# Patient Record
Sex: Male | Born: 1960
Health system: Southern US, Community
[De-identification: ages and names within clinical notes are randomized; demographics above are authoritative.]

---

## 2003-04-16 ENCOUNTER — Ambulatory Visit (HOSPITAL_COMMUNITY): Admission: RE | Admit: 2003-04-16 | Discharge: 2003-04-16 | Payer: Self-pay | Admitting: Orthopedic Surgery

## 2004-09-12 ENCOUNTER — Ambulatory Visit (HOSPITAL_COMMUNITY): Admission: RE | Admit: 2004-09-12 | Discharge: 2004-09-12 | Payer: Self-pay | Admitting: Orthopedic Surgery

## 2004-11-15 ENCOUNTER — Inpatient Hospital Stay (HOSPITAL_COMMUNITY): Admission: RE | Admit: 2004-11-15 | Discharge: 2004-11-17 | Payer: Self-pay | Admitting: Orthopedic Surgery

## 2006-04-10 IMAGING — CR DG PORTABLE PELVIS
2 series · 2 of 2 positions shown · non-contrast
Comparison: 11/09/04.

CLINICAL DATA: Hip replacement. 
 PORTABLE PELVIS (WITH LEFT HIP) - 2 VIEW:

[view not recorded (1 of 2)]
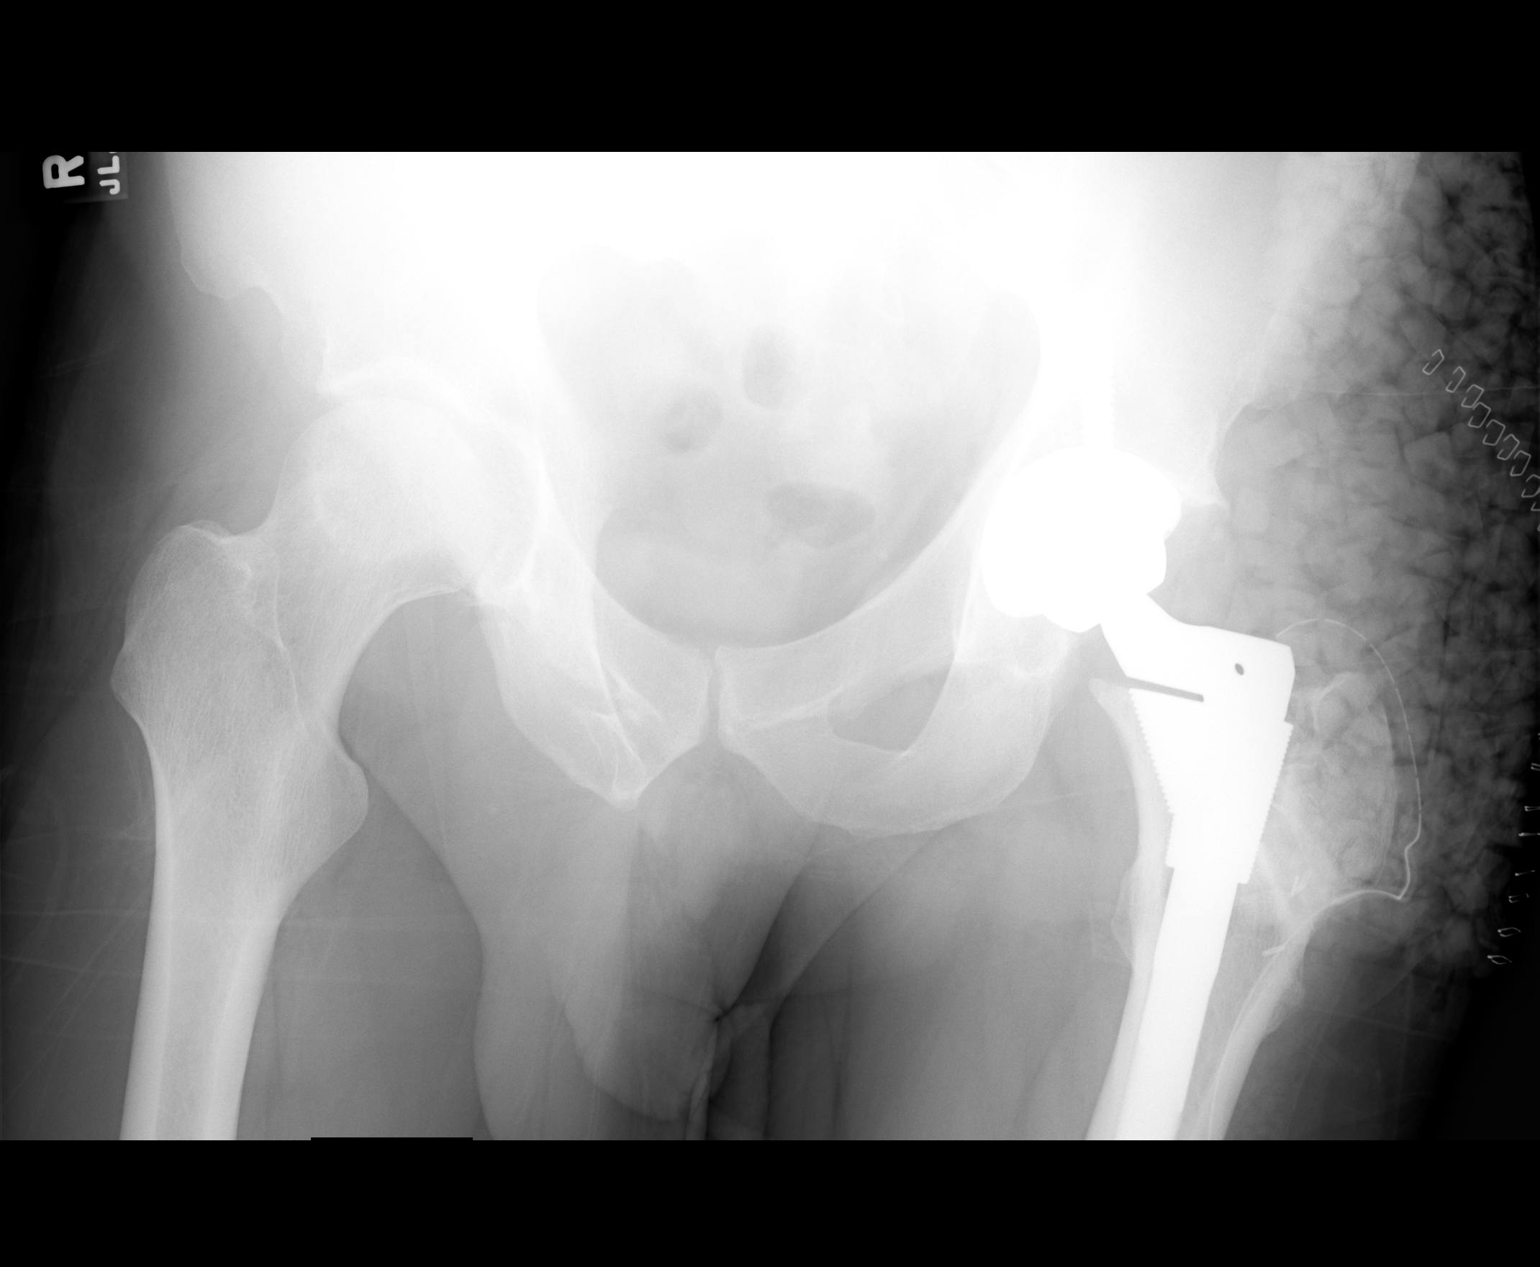

[view not recorded (2 of 2)]
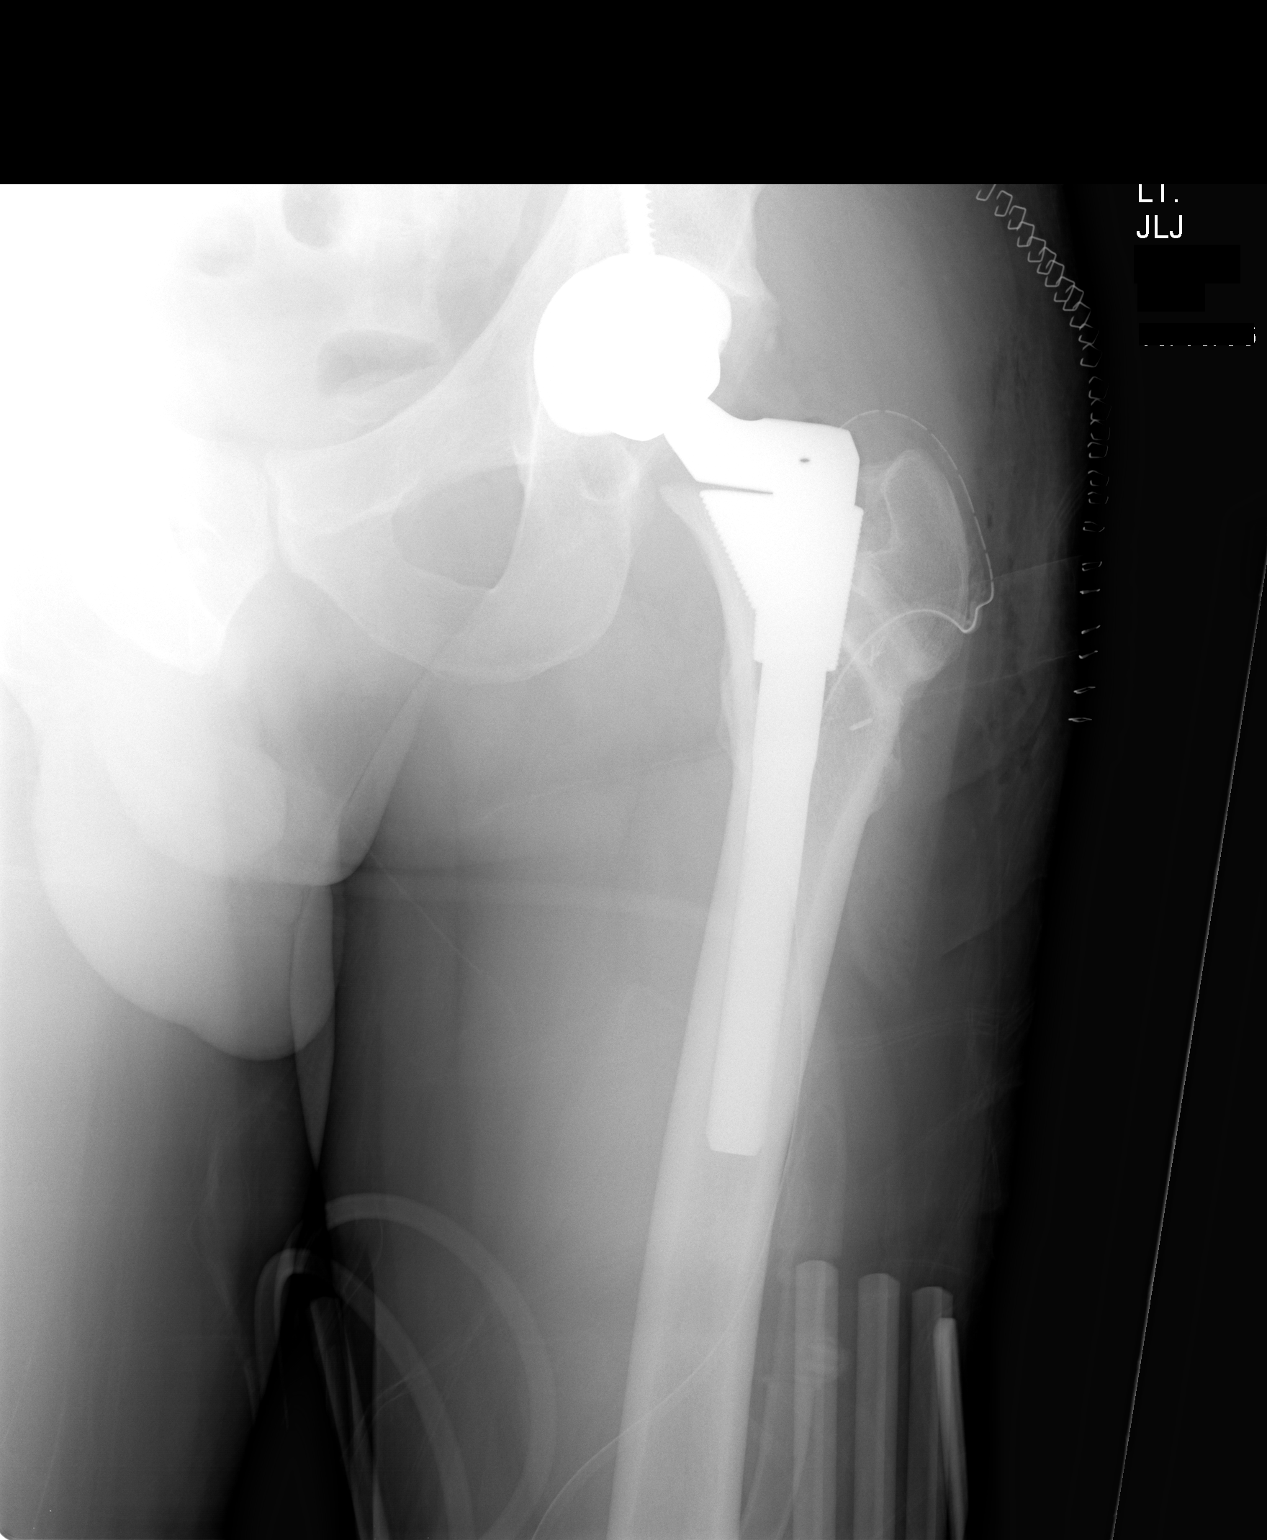

[2 of 2 positions shown; findings below may reference images not displayed]

FINDINGS: The patient has undergone interval left total hip replacement.  Device is located.  No fracture or other complicating features.  Surgical staples and drain are in place.  Avascular necrosis of the right hip is again noted.
IMPRESSION: 1.  Left total hip replacement without complicating features. 
 2.  Avascular necrosis of the right hip.

## 2007-07-19 ENCOUNTER — Encounter: Admission: RE | Admit: 2007-07-19 | Discharge: 2007-07-19 | Payer: Self-pay | Admitting: Orthopedic Surgery

## 2007-11-26 ENCOUNTER — Inpatient Hospital Stay (HOSPITAL_COMMUNITY): Admission: RE | Admit: 2007-11-26 | Discharge: 2007-11-27 | Payer: Self-pay | Admitting: Orthopedic Surgery

## 2009-04-20 IMAGING — CR DG PORTABLE PELVIS
1 series · 1 of 1 positions shown · non-contrast
Comparison: 11/09/2004

CLINICAL DATA: Post right hip arthroplasty

PORTABLE PELVIS

[view not recorded]
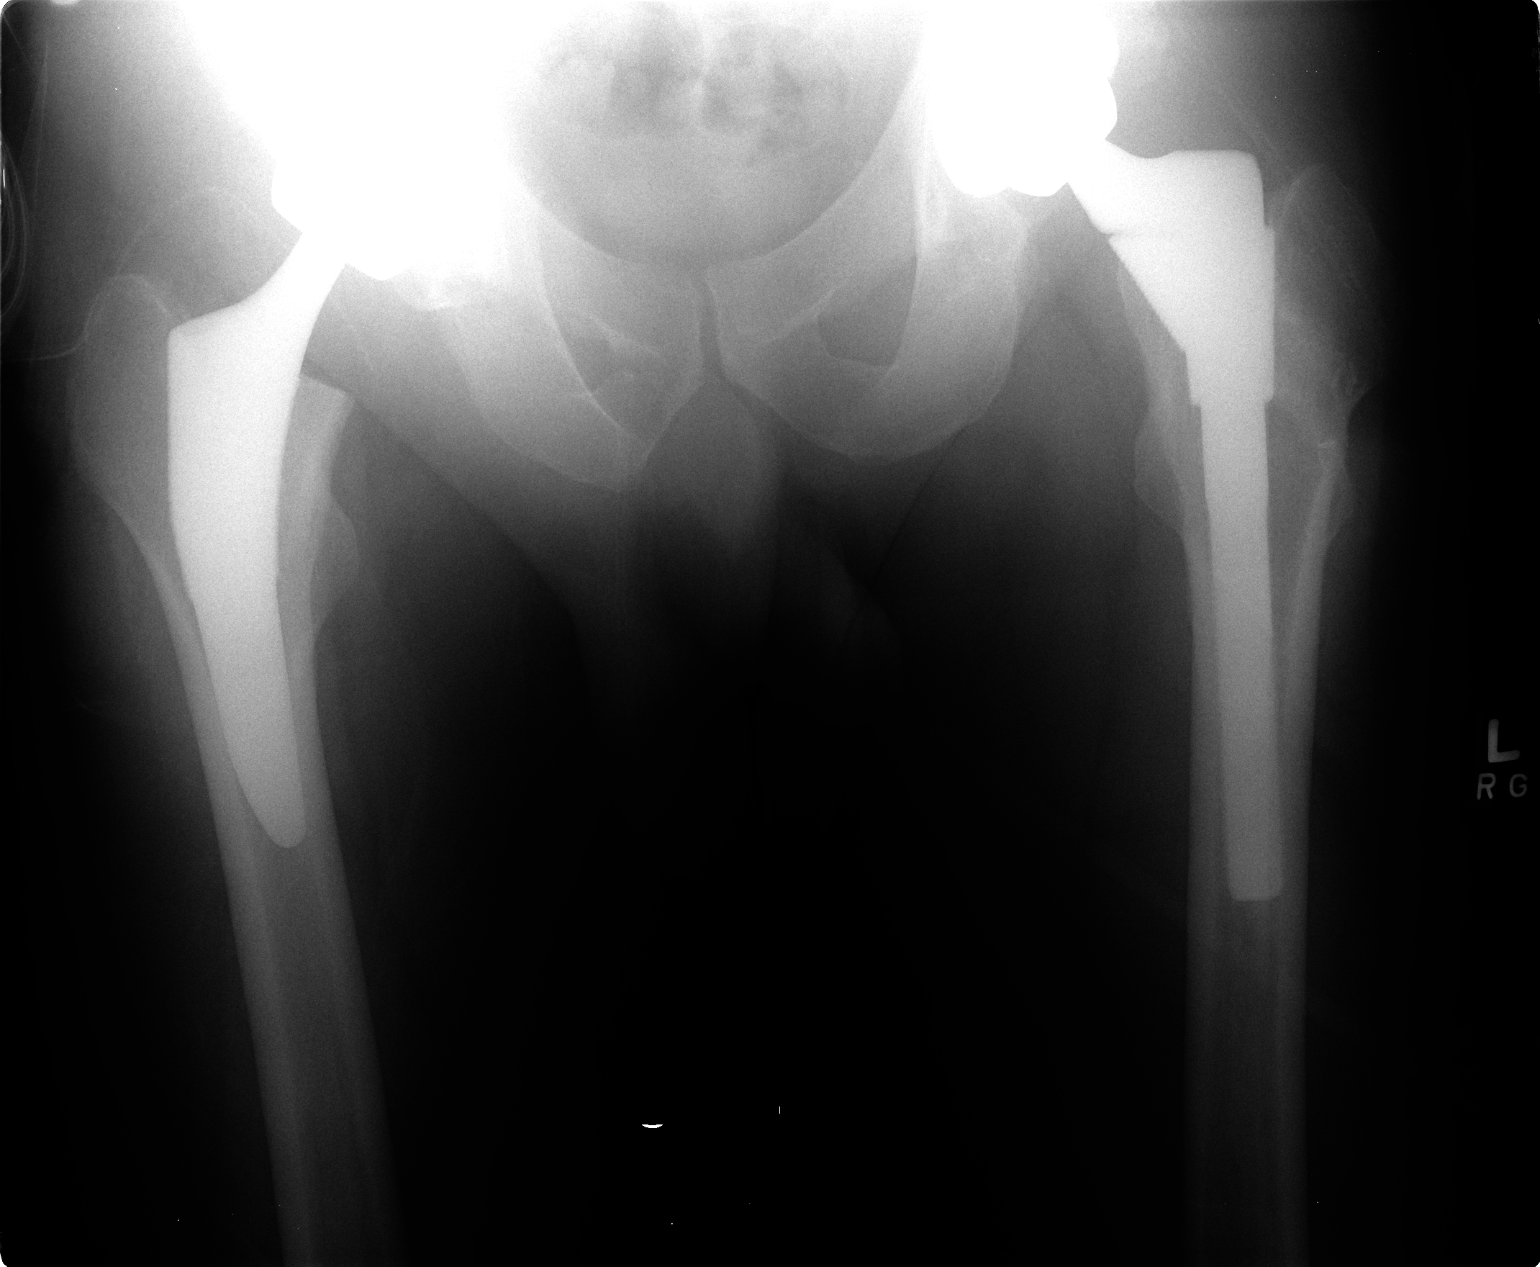

[1 of 1 positions shown; findings below may reference images not displayed]

FINDINGS: The patient has had bilateral hip replacement.  According
to the history, the right hip was operated today.

Good position alignment in one-view.  No acute findings.
IMPRESSION: Good position alignment in one-view following right total hip
replacement.  The patient is also had prior left hip replacement.

## 2010-06-21 NOTE — Op Note (Signed)
NAMELILY, KERNEN NO.:  [id number]   MEDICAL RECORD NO.:  [id number]          PATIENT TYPE:  INP   LOCATION:  1602                         FACILITY:  [facility]   PHYSICIAN:  Madlyn Frankel. Charlann Boxer, M.D.  DATE OF BIRTH:  08-Dec-1960   DATE OF PROCEDURE:  11/26/2007  DATE OF DISCHARGE:  11/27/2007                               OPERATIVE REPORT   ADDENDUM:  It was stated in the operative note that preoperative  diagnosis was right hip avascular necrosis, postoperative diagnosis was  same.  The procedure was misstated as right total knee replacement.  It  should state right total hip replacement.  The remainder of the  procedure notes including the utilization of DePuy hip system in  addition to the body the paragraph are consistent with the procedure of  right total hip replacement.      Madlyn Frankel Charlann Boxer, M.D.  Electronically Signed     MDO/MEDQ  D:  12/19/2007  T:  12/20/2007  Job:  [id number]

## 2010-06-21 NOTE — H&P (Signed)
NAME:  PHOENIX, DRESSER NO.:  [id number]   MEDICAL RECORD NO.:  [id number]          PATIENT TYPE:  INP   LOCATION:                               FACILITY:  [facility]   PHYSICIAN:  Madlyn Frankel. Charlann Boxer, M.D.  DATE OF BIRTH:  Mar 10, 1960   DATE OF ADMISSION:  11/26/2007  DATE OF DISCHARGE:                              HISTORY & PHYSICAL   PROCEDURE:  Right total hip replacement by Dr. Lajoyce Corners.   CHIEF COMPLAINT:  Right hip pain.   HISTORY OF PRESENT ILLNESS:  A 50 year old male with a history of right  hip pain secondary to avascular necrosis.  This has been refractory to  all conservative treatment.  He has a history of left total hip  replacement for this same condition back in 2006 and has done very well.   PRIMARY CARE PHYSICIAN:  Dr. Susa Loffler.   PAST MEDICAL HISTORY:  1. Avascular necrosis.  2. Kidney stones.  3. Gout.   PAST SURGICAL HISTORY:  Left total hip replacement in October 2006.   FAMILY HISTORY:  Heart disease, diabetes, asthma.   SOCIAL HISTORY:  Married, smokes one pack cigarettes, has 25 pack-year  history.   PRIMARY CAREGIVER:  Will be family in the home.   MED ALLERGIES:  NO KNOWN DRUG ALLERGIES.   CURRENT MEDICATIONS:  No current medications.   REVIEW OF SYSTEMS:  See HPI.   PHYSICAL EXAMINATION:  Pulse 72, respirations 16, blood pressure 114/80.  GENERAL:  Awake, alert and oriented, well-developed and well-nourished.  NECK:  Supple, no carotid bruits.  CHEST:  Lungs clear to auscultation bilaterally.  BREASTS:  Deferred.  HEART:  Regular rate and rhythm, S1-S2 distinct.  ABDOMEN:  Soft, nontender, nondistended, bowel sounds present.  GENITOURINARY:  Deferred.  EXTREMITIES:  Right hip has increased pain with range of motion.  SKIN:  No cellulitis.  NEUROLOGIC:  Intact, distal sensibilities.   LABORATORY DATA:  Labs, EKG, chest x-ray all pending pre-surgical  testing.   IMPRESSION:  Right hip osteoarthritis.   PLAN OF ACTION:   Right total hip replacement at [facility] on  November 26, 2007 by Dr. Lajoyce Corners.  Risks and complications were  discussed.   POSTOPERATIVE MEDICATIONS:  Including Lovenox, Robaxin, iron, aspirin,  Colace, MiraLAX provided at the time of history and physical.  Pain  medicines will be dispensed at the time of the surgery.     ______________________________  Yetta Glassman [address], [place]      Madlyn Frankel. Charlann Boxer, M.D.  Electronically Signed    BLM/MEDQ  D:  11/20/2007  T:  11/20/2007  Job:  [id number]   cc:   Susa Loffler, MD

## 2010-06-21 NOTE — Op Note (Signed)
NAME:  Brian Harmon, Brian Harmon NO.:  [id number]   MEDICAL RECORD NO.:  [id number]          PATIENT TYPE:  INP   LOCATION:  0002                         FACILITY:  [facility]   PHYSICIAN:  Madlyn Frankel. Charlann Boxer, M.D.  DATE OF BIRTH:  03-Mar-1960   DATE OF PROCEDURE:  11/26/2007  DATE OF DISCHARGE:                               OPERATIVE REPORT   PREOPERATIVE DIAGNOSIS:  Right hip avascular necrosis.   POSTOPERATIVE DIAGNOSIS:  Right hip avascular necrosis.   PROCEDURE:  Right total knee replacement utilizing DePuy hip system,  size 56 pinnacle cup, 46 neutral metal liner, a size 10 high trial lock  stem with a 40 +5 ball.   SURGEON:  Madlyn Frankel. Charlann Boxer, M.D.   ASSISTANT:  Yetta Glassman. Mann, PA.   ANESTHESIA:  General.   BLOOD LOSS:  400.   COMPLICATIONS:  None.   DRAINS:  Times one medium Hemovac.   SPECIMENS:  None.   INDICATIONS FOR PROCEDURE:  Mr. Farooqui is a 50 year old patient of  mine with an unfortunate progressive of right hip avascular necrosis.  He had been a previous patient of mine after conversion of previous left  hip surgery to a left total hip placement that he has done extremely  well with.  We reviewed the risks and benefits of the hip replacement  procedure, particular in the setting of this primary surgery as opposed  to what he had previously experienced.  Consent was obtained for the  benefit of pain relief.   PROCEDURE IN DETAIL:  The patient was brought to the operative theater.  Once adequate anesthesia, preoperative antibiotics, Ancef 2 gm  administered, the patient was positioned in the right lateral decubitus  position with the right side up.  The right lower extremity was pre-  scrubbed, prepped and draped in a sterile fashion.  A timeout was  performed, identifying the correct extremity and the patient.  A lateral  based incision was made for a posterior approach to the hip.  The  iliotibial band and gluteal fascia were exposed and incised  posteriorly.  The short external rotators were then identified and taken down separate  from the posterior capsule.  An L capsulotomy was made, preserving the  posterior capsule for later anatomic repair, as well as protection of  the sciatic nerve from retractors.  The hip was dislocated.  Severe  avascular changes noted in the femoral head with collapse and cartilage  delamination and wrinkling.   The neck osteotomy was made utilizing a size 6-7 high offset neck with a  28 ball placed in the center of the head.   Attention was now directed to the femur.  I used the box osteotome to  assure laterality along the neck.  I then used a starting drill and  reamed once with a hand reamer and then irrigated the canal to prevent  fat emboli.  I began to broach with a 2-4-6 and then a 7-8-9 initially  went all the way to 9, at the level of the neck cut, and I did not do a  calcar planer to remove a  few mm of bone on the medial side of the neck.   At this point, I attended to the acetabulum.  Acetabular retractors were  placed.  I did have to remove a portion of the anterior capsule and  allowed the femur to be retracted anteriorly due to the significantly  tight and retracted joint space.  Following debridement of the labrum,  identifying calcified labrum anteriorly, I began reaming with a 46  reamer and carried this all the way up to a 54 reamer and then a 55 and  which had an excellent bony bed preparation.  I used an osteotome to  remove the calcified labrum anterior aspect of the pelvis to correctly  identify the orientation of the pelvis, in addition to preventing  impingement.   The final 56 cup was then impacted in approximately 35-40 degrees of  abduction and 20 degrees of forward flexion beneath the anterior wall,  particularly after removing the osteophytes.   A neutral liner was placed as a trial and attention was directed to a  trial reduction.  When I went back to the femoral  side, I impacted the  size 9, was able to get this subside a few more mm, so I went up to a  size 10.  The size 10 sat at the level of my neck cut at this point.  Trial reduction was carried out with a 10 high neck and a 40 +1.5 ball.  The hip was very stable throughout range of motion, no evidence of  impingement with external rotation, abduction.  No evidence of  impingement with forward flexion, internal rotation up to 80 degrees and  no evidence and subluxation in the sleep position.  Leg lengths appeared  to be a little bit shorter than what I had seen in the preoperative  state.  I went ahead and positioned the patient.   Given this, we removed all the trial components and placed a single 40  mm cancellous screw into the ilium.  Due to excellent initial scratch  fit, placed a hole eliminator and then placed a 40 mm metal liner  without difficulty or problems.   The final 10 high trial lock stem was impacted at the level where the  broach sat.  I trialed the 40 +5 ball for lengthening.   On the contralateral hip, I used an SROM system with 20-15 stem with a  36 +10 and +12 offset.  The 40 +5 ball would allow for improved offset  in addition to the stem placement.   Trial reduction revealed that the leg lengths appeared to be comparable  to the down leg and the hip range of motion remained excellent without  evidence of impingement.  Given this, the final 40 +5 ball was impacted  onto a dry trunnion and the hip reduced.  We irrigated the hip  throughout the case.  I reapproximated the posterior leaflet to the  superior leaflet using #1 Vicryl.  A medium Hemovac drain was placed  deep.  The iliotibial band and gluteal fascia were then reapproximated  using #1 Vicryl.  The remaining of the wound was closed with 2-0 Vicryl  and a running 4-0 Monocryl.  The hip was cleaned, dried and dressed  sterilely with a Mepilex dressing.  The patient was taken to the  recovery room in stable  condition, tolerating the procedure well.      Madlyn Frankel Charlann Boxer, M.D.  Electronically Signed     MDO/MEDQ  D:  11/26/2007  T:  11/26/2007  Job:  [id number]

## 2010-06-24 NOTE — Discharge Summary (Signed)
NAME:  Brian Harmon, Brian Harmon NO.:  [id number]   MEDICAL RECORD NO.:  [id number]          PATIENT TYPE:  INP   LOCATION:  1508                         FACILITY:  [facility]   PHYSICIAN:  Madlyn Frankel. Charlann Boxer, M.D.  DATE OF BIRTH:  Mar 04, 1960   DATE OF ADMISSION:  11/15/2004  DATE OF DISCHARGE:  11/17/2004                                 DISCHARGE SUMMARY   ADMISSION DIAGNOSIS:  Failed left hip prevascular fibular grafting for  diagnosis of avascular necrosis.   DISCHARGE DIAGNOSIS:  Failed left hip prevascular fibular grafting for  diagnosis of avascular necrosis.   OPERATION:  On November 15, 2004 the patient underwent conversion of previous  hip surgery to a left total hip arthroplasty.   ASSISTANT:  Dr. Georges Lynch. Gioffre.   BRIEF HISTORY:  This 50 year old male had been seen by Dr. Charlann Boxer for  continued and progressive problems concerning pain into his left hip.  The  patient had a vascularized fibular grafting at [facility] some time  back.  Unfortunately, this failed to provide long term relief and he has  collapse and progressive pain into his left hip.  X-rays have shows this  collapse as well.  After the risks and benefits of surgery, the patient was  admitted for the above procedure.   COURSE IN THE HOSPITAL:  The patient tolerated his surgical procedure quite  well.  He remained awake, alert, and quite cheerful.  He was very pleased  with his surgical intervention with marked reduction in his hip joint pain  but had a mild amount of lateral incisional pain.  He began ambulating in  his room at 50% weightbearing as instructed.  It was felt that he could be  maintained in his home environment so arrangements were made for discharge.  On the day of discharge his wound.  Neurovascular was intact to the  operative extremity.  Homan's sign was negative.  His calf was soft.  He had  no respiratory complaints.   LABORATORY VALUES IN THE HOSPITAL:  Hematologically showed  a preoperative  CBC completely within normal limits.  Hemoglobin was 15.2, hematocrit was  43.8.  The final hemoglobin was 12.4, hematocrit was 34.9.  Blood  chemistries were negative.  A urinalysis was negative for urinary tract  infection.  A chest x-ray showed no acute disease.  No electrocardiogram was  done.   CONDITION ON DISCHARGE:  Improved and stable.   PLAN:  The patient is discharged to his home with [facility].  He  will return to see Dr. Charlann Boxer two weeks after the date of surgery.  Use dry  dressing p.r.n. and continue with 50% weightbearing on the operative  extremity.  Prescriptions were given for Vicodin for discomfort and Robaxin  as a muscle relaxant.      Dooley L. Cherlynn June.      Madlyn Frankel Charlann Boxer, M.D.  Electronically Signed    DLU/MEDQ  D:  11/23/2004  T:  11/23/2004  Job:  [id number]

## 2010-06-24 NOTE — Discharge Summary (Signed)
Brian Harmon, Brian Harmon NO.:  [id number]   MEDICAL RECORD NO.:  [id number]          PATIENT TYPE:  INP   LOCATION:  1602                         FACILITY:  [facility]   PHYSICIAN:  Madlyn Frankel. Charlann Boxer, M.D.  DATE OF BIRTH:  Apr 20, 1960   DATE OF ADMISSION:  11/26/2007  DATE OF DISCHARGE:  11/27/2007                               DISCHARGE SUMMARY   ADMITTING DIAGNOSES:  1. Avascular necrosis.  2. Gout.  3. History of kidney stones.   DISCHARGE DIAGNOSES:  1. Avascular necrosis.  2. Gout.  3. Kidney stones.   HISTORY OF PRESENT ILLNESS:  A 50 year old male with a history of right  hip pain secondary to avascular necrosis.  Refractory to all  conservative treatment.   CONSULTS:  None.   PROCEDURE:  Right total hip replacement.  Surgeon, Dr. Durene Romans.  Assistant, Dwyane Luo P.A.-C.  Components used were metal on metal.   LABORATORY DATA:  CBC, final reading, white blood cell count of 6.1,  hemoglobin 13.3, hematocrit 38.2, and platelets 177.  His white cell  differential all within normal limits.  Coagulation, no significant  abnormalities.  Routine chemistry, final readings, sodium 137, potassium  3.9, glucose 148, and creatinine 1.26.  Calcium 8.8.  UA was negative.   CARDIOLOGY:  EKG was sinus bradycardia, otherwise normal ECG.   RADIOLOGY:  No chest x-ray found on chart.   HOSPITAL COURSE:  Patient underwent right total hip replacement and  admitted to orthopedic floor in stable condition.  He made excellent  progress on day 1 on November 27, 2007.  His dressing was dry.  Hemovac  was discontinued.  Minimal drop hemodynamically.  After physical therapy  done twice that day,  patient was stable and ready to go home.   DISCHARGE DISPOSITION:  Discharged home, stable improved condition,  postop day #1 with excellent progress and minimal blood loss.   DISCHARGE PHYSICAL THERAPY:  Weightbearing as tolerated with use of  rolling walker.   DISCHARGE DIET:   Regular as tolerated by patient.   DISCHARGE WOUND CARE:  Keep dry.   DISCHARGE MEDICATIONS:  1. Lovenox 40 mg subcu q.24 x11 days.  2. Robaxin 500 mg p.o. q.6 p.r.n. muscle spasm pain.  3. Enteric-coated aspirin 325 mg p.o. daily x4 weeks after Lovenox      completed.  4. Iron 325 mg p.o. t.i.d.  5. Colace 100 mg p.o. b.i.d.  6. MiraLax 17 g p.o. daily.  7. Vicodin 5/325 one to two p.o. q.4 to 6 p.r.n. pain.   DISCHARGE FOLLOWUP:  Follow up with Dr. Charlann Boxer at phone number [phone] in  2 weeks.     ______________________________  Yetta Glassman [address], [place]      Madlyn Frankel. Charlann Boxer, M.D.  Electronically Signed    BLM/MEDQ  D:  12/31/2007  T:  12/31/2007  Job:  [id number]   cc:   Susa Loffler, M.D.

## 2010-06-24 NOTE — H&P (Signed)
NAME:  Brian Harmon, Brian Harmon              ACCOUNT NO.:  [id number]   MEDICAL RECORD NO.:  [id number]          PATIENT TYPE:  INP   LOCATION:  NA                           FACILITY:  [facility]   PHYSICIAN:  Madlyn Frankel. Charlann Boxer, M.D.  DATE OF BIRTH:  [birth year]   DATE OF ADMISSION:  DATE OF DISCHARGE:                                HISTORY & PHYSICAL   CHIEF COMPLAINT:  Left hip pain.   HISTORY OF PRESENT ILLNESS:  The patient has a history of a failed graft in  his left hip secondary to avascular necrosis.  The patient has elected to  have a left total hip arthroplasty by Dr. Molli Hazard . Charlann Boxer.   He has no known drug allergies, although he does get nauseous with  OxyContin.   MEDICATIONS:  None.   PAST HISTORY:  Negative.   SOCIAL HISTORY:  He does not smoke.  No alcohol use.   PRIMARY CARE PHYSICIAN:  [facility].   FAMILY MEDICAL HISTORY:  Negative.   REVIEW OF SYSTEMS:  Negative except for pain with ambulation.   PHYSICAL EXAMINATION:  VITAL SIGNS:  Pulse 90, respirations 18, blood  pressure 130/90.  GENERAL:  This is a healthy-appearing 50 year old male with no acute  distress, pleasant mood and affect, alert and oriented x3.  HEAD/NECK:  Shows cranial nerves II-XII grossly intact.  He has full range  of motion without any difficulty and no tenderness.  CHEST:  Active breath sounds bilaterally with no wheezes, rhonchi, or rales.  HEART:  Shows a regular rate and rhythm with no murmur.  ABDOMEN:  Nontender, nondistended with active bowel sounds in all quadrants  and no pulsatile masses.  EXTREMITIES:  Show mild tenderness to the left hip with internal and  external rotation but neurovascular is intact.  Deep tendon reflexes are 1+  and equal bilaterally.  No rashes or cellulitis were noted.   X-ray shows left hip AVN.   IMPRESSION:  Left hip avascular necrosis.   PLAN:  Have a left total hip arthroplasty by Dr. Durene Romans.      Thomas B. Durwin Nora, P.A.      Madlyn Frankel Charlann Boxer, M.D.  Electronically Signed    TBD/MEDQ  D:  11/02/2004  T:  11/02/2004  Job:  [id number]

## 2010-06-24 NOTE — Op Note (Signed)
NAME:  INDALECIO, MALMSTROM NO.:  [id number]   MEDICAL RECORD NO.:  [id number]          PATIENT TYPE:  INP   LOCATION:  X002                         FACILITY:  [facility]   PHYSICIAN:  Madlyn Frankel. Charlann Boxer, M.D.  DATE OF BIRTH:  Jun 01, 1960   DATE OF PROCEDURE:  11/15/2004  DATE OF DISCHARGE:                                 OPERATIVE REPORT   PREOPERATIVE DIAGNOSIS:  Failed left hip free vascularized fibula grafting  for diagnosis of avascular necrosis.   POSTOPERATIVE DIAGNOSIS:  Failed left hip free vascularized fibula grafting  for diagnosis of avascular necrosis.   PROCEDURE:  Conversion of previous hip surgery to left total hip  replacement.   COMPONENTS USED:  The DePuy hip system with a size 54 Pinnacle cup, two  cancellous bone screws, and 54 36 neutral liner.  A 20 x 13 36 plus 12  femoral stem with a 20D large sleeve and a 36 plus 0 ball.   ASSISTANT:  Georges Lynch. Darrelyn Hillock, M.D.   ANESTHESIA:  General.   BLOOD LOSS:  600.   COMPLICATIONS:  None.   DRAINS:  One.   INDICATION FOR THE PROCEDURE:  Mr. Cloe is a 50 year old male who I have  been following in the office over the past couple of years for a diagnosis  of avascular necrosis.  He opted, after reviewing the surgical procedures,  for a free vascularized fibula grafting.  This has failed to provide long-  term relief and he has gone on to collapse and have progressive pain in his  left hip.  At this point, he wished to proceed with left total hip  replacement and not have any further conservative care.  The risks and  benefits of the procedure were reviewed including dislocation, component  failure, nerve injury.  The components were discussed in terms of bearing  surfaces and metal-on-metal was chosen.   PROCEDURE IN DETAIL:  The patient was brought to the operating theater.  Once adequate anesthesia and then preoperative antibiotics (1 g of Ancef)  were administered, the patient was positioned in  the right lateral decubitus  position with the left side up.  The patient's previous incision was  identified and noted to be slightly anterior to the greater trochanter.  Basically, the entire portion of this incision was used to allow for soft  tissue mobilization.  The area over the trochanter was noted to be extremely  scarred down from the previous surgery.  This was released within a portion  of the wound to allow for soft tissue mobilization.  The iliotibial band and  gluteus maximus fascia were then incised in line with the incision  posteriorly.  The short external rotators were taken down separate from the  posterior capsule, which was saved for protection of the sciatic nerve  during retraction and then later repaired with a superior leaflet.  The  femoral head was then dislocated.  The was noted to have a pretty steep  valgus neck and neck osteotomy was made based off preoperative templating  and anatomic landmarks.  Once this was done, the acetabular exposure was  obtained.  This was noted to be difficult due to contraction and scar  tissue.  However, performing a capsulotomy anteriorly and removing some of  the tissue anteriorly did allow for the femur to be displaced anteriorly.  With this displaced anteriorly, reaming commenced with a 45 reamer to the  medial wall and then commenced up using the minireamer.  I was able to ream  up sequentially to a 53 reamer.  I got excellent bone purchase and bone bed  preparation.  It did appear that the reaming was a little proximal to the  teardrop; however, anatomically there was an ample supply of bone to 43  reaming.  With this, a 53 cup was impacted.  I did delineate a portion of  the anterior and posterior walls that appeared to have osteophytic change to  remove.  A 54 cup was impacted to the base and then two cancellous bone  screws placed.  The final cup position was about 40 degrees of abduction and  15 to 20 degrees of forward  flexion beneath the anterior wall.  A 5/8 curved  osteotome was then used to remove osteophytes posterior and anterior.  At  this point, a trial liner was placed and attention was directed to the  femur.  The femur was then prepared per protocol for the S-ROM prosthesis.  The only distinction was that based on the incorporated free vascular fibula  that was present, the high-speed bur was utilized to initiate and prepare  the bed and assure laterality of the components.  Once this was done, the  preparation of the femur was carried out per protocol, reaming up to a size  15 with an excellent purchase distally and then to a 15 size 3/4 of the way  down, proximally a 20D and then mid to a D large.  The patient's femoral  neck was noted to be very capacious; however, in the metaphyseal region D  fit.  With this in place, I trimmed any remaining bone visible.  The  patient's sleeve was positioned in a near anatomic position with about 10  degrees anteversion.  I did initial trial reduction based on the  preoperative templating to a 36 plus 12 stem.  I used a 36 ball.  The  patient's leg length appeared equal on the table.  There was some evidence  of impingement anteriorly.  The combined anteversion at that point was about  25 to 30 degrees.  With this, I dialed in about 3 degrees of anteversion on  the trial stem with the 36 plus 0 ball.  Again, the leg lengths appeared to  be equal compared to the preoperative evaluation and preoperative  radiographs.  With the anteversion added, the combined anteversion now of  this system was about 45 degrees.  The hip was much more stable and  tolerating flexion in 80 degrees, neutral abduction and internal rotation to  80 degrees, stable in the sleep position with adduction to 30 degrees and  internal rotation to 80 degrees.  There was a 0.5 mm shelf, probably related  to some of the tight tissues anteriorly.  There was no evidence of impingement  posteriorly following the removal of the osteophytes.  At this  point, the final components were brought into the field.  The manhole cover  was placed in the acetabulum and the final 54 x 36 neutral metal liner was  impacted into the shell.  The final 20D large sleeve was impacted into the  femoral neck to the level of the neck cut followed by placement of the stem,  which I positioned at about 15 degrees of anteversion.  This sat very well  onto the bone.  At this point, the final 36 plus 0 ball was impacted under a  clean and dry trunnion and the hip reduced.  It was stable to trial  reduction.   At this point, the wound was copiously irrigated with normal saline solution  as had been done throughout the case.  The medium Hemovac drain was placed  deep.  The posterior capsule was reapproximated with the superior leaflet.  The iliotibial band was then reapproximated using #1 Ethibond and #1 Vicryl  on the gluteus maximus fascia.  I did try to debride some of the scar and  bursa that were scarred down in the lateral aspect of the hip.  The  remainder of the wound was closed in layers with staples on the skin.  The  skin was then cleaned, dried, and dressed sterilely with Adaptic, dressing  sponges, tapes.  The patient was then extubated and brought to the recovery  room in stable condition.  He tolerated the procedure without complication.      Madlyn Frankel Charlann Boxer, M.D.  Electronically Signed     MDO/MEDQ  D:  11/15/2004  T:  11/15/2004  Job:  [id number]

## 2010-11-07 LAB — CBC
HCT: 38.2 — ABNORMAL LOW
HCT: 45.2
Hemoglobin: 13.3
Hemoglobin: 15.6
MCHC: 34.6
MCHC: 34.9
MCV: 88.6
MCV: 89.4
Platelets: 177
Platelets: 191
RBC: 4.27
RBC: 5.09
RDW: 12.6
RDW: 12.8
WBC: 4.4
WBC: 6.1

## 2010-11-07 LAB — APTT: aPTT: 22 — ABNORMAL LOW

## 2010-11-07 LAB — BASIC METABOLIC PANEL
BUN: 14
BUN: 9
CO2: 25
CO2: 29
Calcium: 8.8
Calcium: 9.6
Chloride: 103
Chloride: 105
Creatinine, Ser: 1.18
Creatinine, Ser: 1.26
GFR calc Af Amer: >60
GFR calc Af Amer: >60
GFR calc non Af Amer: >60
GFR calc non Af Amer: >60
Glucose, Bld: 139 — ABNORMAL HIGH
Glucose, Bld: 148 — ABNORMAL HIGH
Potassium: 3.9
Potassium: 3.9
Sodium: 137
Sodium: 137

## 2010-11-07 LAB — URINALYSIS, ROUTINE W REFLEX MICROSCOPIC
Bilirubin Urine: NEGATIVE
Glucose, UA: NEGATIVE
Hgb urine dipstick: NEGATIVE
Ketones, ur: NEGATIVE
Nitrite: NEGATIVE
Protein, ur: NEGATIVE
Specific Gravity, Urine: 1.022
Urobilinogen, UA: 0.2
pH: 5.5

## 2010-11-07 LAB — DIFFERENTIAL
Basophils Absolute: 0
Basophils Relative: 0
Eosinophils Absolute: 0
Eosinophils Relative: 1
Lymphocytes Relative: 40
Lymphs Abs: 1.8
Monocytes Absolute: 0.3
Monocytes Relative: 8
Neutro Abs: 2.2
Neutrophils Relative %: 51

## 2010-11-07 LAB — ABO/RH: ABO/RH(D): A POS

## 2010-11-07 LAB — TYPE AND SCREEN
ABO/RH(D): A POS
Antibody Screen: NEGATIVE

## 2010-11-07 LAB — PROTIME-INR
INR: 1
Prothrombin Time: 13.2

## 2016-05-17 DIAGNOSIS — IMO0001 Reserved for inherently not codable concepts without codable children: Secondary | ICD-10-CM | POA: Insufficient documentation

## 2016-05-17 DIAGNOSIS — E1165 Type 2 diabetes mellitus with hyperglycemia: Secondary | ICD-10-CM

## 2016-10-15 NOTE — Progress Notes (Signed)
[name] [address] [place], [place] [address] Phone: [phone] Subjective:    I'm seeing this patient by the request  of:    CC:  Right lower back pain  [id number]  Brian Harmon is a 56 y.o. male coming in with complaint of back pain. He fractured a vertebrae in August when a lawn mower rolled over. He said that he feels lower back pain, bilaterally with more pain on the right. His pain comes and goes and is worse when he is sedentary. His pain is dull, achy pain. Denies any radiating pain. Patient did have a fall. Went to urgent care which tended have x-rays. X-rays were independently visualized by me. X-rays that show the patient does have bilateral sacroiliac arthritis. Patient was also found to have an L3 compression fracture noted. Seem to be subacute. Patient points to most of the pain seeming to be on the right side and points towards the sacroiliac joint rates the severity of pain as 5 out of 10.     No past medical history on file. Past mental history significant for avascular necrosis of the hips bilaterally status post replacement No past surgical history on file. Social History   Social History  . Marital status: Married    Spouse name: N/A  . Number of children: N/A  . Years of education: N/A   Social History Main Topics  . Smoking status: Not on file  . Smokeless tobacco: Not on file  . Alcohol use Not on file  . Drug use: Unknown  . Sexual activity: Not on file   Other Topics Concern  . Not on file   Social History Narrative  . No narrative on file   Allergies not on file No family history on file.   Past medical history, social, surgical and family history all reviewed in electronic medical record.  No pertanent information unless stated regarding to the chief complaint.   Review of Systems:Review of systems updated and as accurate as of 10/16/16  No headache, visual changes, nausea, vomiting, diarrhea,  constipation, dizziness, abdominal pain, skin rash, fevers, chills, night sweats, weight loss, swollen lymph nodes, body aches, joint swelling,  chest pain, shortness of breath, mood changes. Positive muscle aches  Objective  Blood pressure 90/62, pulse (!) 58, height  (1.93 m), weight 231 lb (104.8 kg), SpO2 98 %. Systems examined below as of 10/16/16   General: No apparent distress alert and oriented x3 mood and affect normal, dressed appropriately.  HEENT: Pupils equal, extraocular movements intact  Respiratory: Patient's speak in full sentences and does not appear short of breath  Cardiovascular: No lower extremity edema, non tender, no erythema  Skin: Warm dry intact with no signs of infection or rash on extremities or on axial skeleton.  Abdomen: Soft nontender  Neuro: Cranial nerves II through XII are intact, neurovascularly intact in all extremities with 2+ DTRs and 2+ pulses.  Lymph: No lymphadenopathy of posterior or anterior cervical chain or axillae bilaterally.  Gait mild antalgic gait MSK:  Non tender with full range of motion and good stability and symmetric strength and tone of shoulders, elbows, wrist, hip, knee and ankles bilaterally.  Back Exam:  Inspection: Mild loss in lordosis Motion: Flexion 40 deg, Extension 5 deg, Side Bending to 40 deg bilaterally,  Rotation to 40 deg bilaterally  SLR laying: Negative  XSLR laying: Negative  Palpable tenderness: Severe tenderness over the right sacroiliac joint. FABER: Positive right. Sensory  change: Gross sensation intact to all lumbar and sacral dermatomes.  Reflexes: 2+ at both patellar tendons, 2+ at achilles tendons, Babinski's downgoing.  Strength at foot  Plantar-flexion: 5/5 Dorsi-flexion: 5/5 Eversion: 5/5 Inversion: 5/5  Leg strength  Quad: 5/5 Hamstring: 5/5 Hip flexor: 5/5 Hip abductors: 4/5 but symmetric Gait unremarkable.  [id number]; 15 additional minutes spent for Therapeutic exercises as stated in above  notes.  This included exercises focusing on stretching, strengthening, with significant focus on eccentric aspects.   Long term goals include an improvement in range of motion, strength, endurance as well as avoiding reinjury. Patient's frequency would include in 1-2 times a day, 3-5 times a week for a duration of 6-12 weeks. Low back exercises that included:  Pelvic tilt/bracing instruction to focus on control of the pelvic girdle and lower abdominal muscles  Glute strengthening exercises, focusing on proper firing of the glutes without engaging the low back muscles Proper stretching techniques for maximum relief for the hamstrings, hip flexors, low back and some rotation where tolerated   Proper technique shown and discussed handout in great detail with ATC.  All questions were discussed and answered.     Impression and Recommendations:     This case required medical decision making of moderate complexity.      Note: This dictation was prepared with Dragon dictation along with smaller phrase technology. Any transcriptional errors that result from this process are unintentional.

## 2016-10-16 ENCOUNTER — Other Ambulatory Visit (INDEPENDENT_AMBULATORY_CARE_PROVIDER_SITE_OTHER): Payer: Managed Care, Other (non HMO)

## 2016-10-16 ENCOUNTER — Ambulatory Visit (INDEPENDENT_AMBULATORY_CARE_PROVIDER_SITE_OTHER): Payer: Managed Care, Other (non HMO) | Admitting: Family Medicine

## 2016-10-16 VITALS — BP 90/62 | HR 58 | Ht 76.0 in | Wt 231.0 lb

## 2016-10-16 DIAGNOSIS — S32030A Wedge compression fracture of third lumbar vertebra, initial encounter for closed fracture: Secondary | ICD-10-CM | POA: Diagnosis not present

## 2016-10-16 DIAGNOSIS — M4698 Unspecified inflammatory spondylopathy, sacral and sacrococcygeal region: Secondary | ICD-10-CM | POA: Diagnosis not present

## 2016-10-16 DIAGNOSIS — M47818 Spondylosis without myelopathy or radiculopathy, sacral and sacrococcygeal region: Secondary | ICD-10-CM | POA: Insufficient documentation

## 2016-10-16 DIAGNOSIS — M2550 Pain in unspecified joint: Secondary | ICD-10-CM

## 2016-10-16 LAB — COMPREHENSIVE METABOLIC PANEL
ALK PHOS: 81 U/L (ref 39–117)
ALT: 36 U/L (ref 0–53)
AST: 22 U/L (ref 0–37)
Albumin: 4.4 g/dL (ref 3.5–5.2)
BUN: 22 mg/dL (ref 6–23)
CHLORIDE: 104 meq/L (ref 96–112)
CO2: 23 mEq/L (ref 19–32)
Calcium: 10.2 mg/dL (ref 8.4–10.5)
Creatinine, Ser: 1.29 mg/dL (ref 0.40–1.50)
GFR: 61.54 mL/min (ref >60.00)
GLUCOSE: 114 mg/dL — AB (ref 70–99)
POTASSIUM: 4.3 meq/L (ref 3.5–5.1)
Sodium: 140 mEq/L (ref 135–145)
Total Bilirubin: 0.3 mg/dL (ref 0.2–1.2)
Total Protein: 7 g/dL (ref 6.0–8.3)

## 2016-10-16 LAB — IBC PANEL
IRON: 104 ug/dL (ref 42–165)
Saturation Ratios: 29.2 % (ref 20.0–50.0)
TRANSFERRIN: 254 mg/dL (ref 212.0–360.0)

## 2016-10-16 LAB — CBC WITH DIFFERENTIAL/PLATELET
BASOS PCT: 0.5 % (ref 0.0–3.0)
Basophils Absolute: 0 10*3/uL (ref 0.0–0.1)
EOS PCT: 0.7 % (ref 0.0–5.0)
Eosinophils Absolute: 0 10*3/uL (ref 0.0–0.7)
HCT: 45.6 % (ref 39.0–52.0)
HEMOGLOBIN: 15.4 g/dL (ref 13.0–17.0)
Lymphocytes Relative: 34.1 % (ref 12.0–46.0)
Lymphs Abs: 1.3 10*3/uL (ref 0.7–4.0)
MCHC: 33.7 g/dL (ref 30.0–36.0)
MCV: 89.9 fl (ref 78.0–100.0)
MONO ABS: 0.3 10*3/uL (ref 0.1–1.0)
MONOS PCT: 7.9 % (ref 3.0–12.0)
Neutro Abs: 2.2 10*3/uL (ref 1.4–7.7)
Neutrophils Relative %: 56.8 % (ref 43.0–77.0)
Platelets: 190 10*3/uL (ref 150.0–400.0)
RBC: 5.07 Mil/uL (ref 4.22–5.81)
RDW: 12.4 % (ref 11.5–15.5)
WBC: 3.9 10*3/uL — ABNORMAL LOW (ref 4.0–10.5)

## 2016-10-16 LAB — TSH: TSH: 2.74 u[IU]/mL (ref 0.35–4.50)

## 2016-10-16 LAB — C-REACTIVE PROTEIN: CRP: 0.3 mg/dL — ABNORMAL LOW (ref 0.5–20.0)

## 2016-10-16 LAB — SEDIMENTATION RATE: Sed Rate: 1 mm/hr (ref 0–20)

## 2016-10-16 LAB — VITAMIN D 25 HYDROXY (VIT D DEFICIENCY, FRACTURES): VITD: 18.54 ng/mL — AB (ref 30.00–100.00)

## 2016-10-16 MED ORDER — DICLOFENAC SODIUM 2 % TD SOLN: 2.0000 "application " | Freq: Two times a day (BID) | TRANSDERMAL | 3 refills | Status: AC

## 2016-10-16 MED ORDER — VITAMIN D (ERGOCALCIFEROL) 1.25 MG (50000 UNIT) PO CAPS: 50000.0000 [IU] | ORAL_CAPSULE | ORAL | 0 refills | Status: DC

## 2016-10-16 NOTE — Patient Instructions (Addendum)
Good to see you.  Ice 20 minutes 2 times daily. Usually after activity and before bed. Exercises 3 times a week.  pennsaid pinkie amount topically 2 times daily as needed.   Once weekly vitamin D for 12 weeks.  Turmeric  twice daily  Tart cherry extract any dose at night Lets get labs downstairs See me again in 3 weeks

## 2016-10-16 NOTE — Assessment & Plan Note (Signed)
Discussed with patient at length. We'll check patient's possible vitamin D level to see if this is contribute in. Patient has any worsening symptoms we'll consider further evaluation and treatment. Patient did have an acute injury that likely contributed to it. Any other occult fractures occur further workup will be needed.

## 2016-10-16 NOTE — Assessment & Plan Note (Signed)
Patient does have some arthritis on the sacroiliac joint bilaterally. Has had avascular necrosis of the hips bilaterally. Discussed with patient at great length, we discussed icing regimen, home exercises, patient driving topical anti-inflammatories, once weekly vitamin D and we discussed over-the-counter medications. Home exercises given. Patient work with Event organiserathletic trainer. Laboratory workup for any autoimmune diseases that possibly could be causing some ankylosing spondylitis. Follow-up again in 4 weeks.

## 2016-10-17 LAB — PTH, INTACT AND CALCIUM
Calcium: 9.8 mg/dL (ref 8.6–10.3)
PTH: 32 pg/mL (ref 14–64)

## 2016-10-17 LAB — RHEUMATOID FACTOR: Rhuematoid fact SerPl-aCnc: <14 IU/mL (ref <14)

## 2016-10-17 LAB — CALCIUM, IONIZED: Calcium, Ion: 5.5 mg/dL (ref 4.8–5.6)

## 2016-10-17 LAB — ANA: Anti Nuclear Antibody(ANA): NEGATIVE

## 2016-10-18 ENCOUNTER — Other Ambulatory Visit: Payer: Self-pay

## 2016-10-18 NOTE — Progress Notes (Signed)
Left message for patient to call back to get lab results

## 2016-10-18 NOTE — Progress Notes (Signed)
Spoke with patient in regards to lab results. He is going to pick up Vitamin D today.

## 2016-10-21 LAB — HLA-B27 ANTIGEN: HLA B27: NEGATIVE

## 2016-11-13 ENCOUNTER — Ambulatory Visit: Payer: Managed Care, Other (non HMO) | Admitting: Family Medicine

## 2016-11-29 NOTE — Progress Notes (Signed)
[facility] D.[facility] [address] [place], [place] [address] Phone: [phone]) [phone] Subjective:    I'm seeing this patient by the request  of:    CC: Low back pain follow-up  QIO:[id number]:Subjective  Brian LincolnDavid E Harmon is a 56 y.o. male coming in with complaint of low back pain. Found to have arthritis of the left sacroiliac joint. Actually seem to be more bilateral. Also found to have an L3 compression fracture. Also found to have a vitamin D deficiency. Patient was started on once weekly vitamin D and was to take over-the-counter medications. Given topical anti-inflammatories as well. Patient states Doing well overall. 90% better.  Minimal discomfort at this time. Happy with the results. Doing a lot of activity.     No past medical history on file. No past surgical history on file. Social History   Social History  . Marital status: Married    Spouse name: N/A  . Number of children: N/A  . Years of education: N/A   Social History Main Topics  . Smoking status: Never Smoker  . Smokeless tobacco: Never Used  . Alcohol use None  . Drug use: Unknown  . Sexual activity: Not Asked   Other Topics Concern  . None   Social History Narrative  . None   Not on File No family history on file. No family history of autoimmune   Past medical history, social, surgical and family history all reviewed in electronic medical record.  No pertanent information unless stated regarding to the chief complaint.   Review of Systems:Review of systems updated and as accurate as of 11/30/16  No headache, visual changes, nausea, vomiting, diarrhea, constipation, dizziness, abdominal pain, skin rash, fevers, chills, night sweats, weight loss, swollen lymph nodes, body aches, joint swelling, chest pain, shortness of breath, mood changes. Positive muscle aches  Objective  Blood pressure 118/70, pulse 69, height 6\' 4"  (1.93 m), weight 230 lb (104.3 kg), SpO2 98 %. Systems examined below as of  11/30/16   General: No apparent distress alert and oriented x3 mood and affect normal, dressed appropriately.  HEENT: Pupils equal, extraocular movements intact  Respiratory: Patient's speak in full sentences and does not appear short of breath  Cardiovascular: No lower extremity edema, non tender, no erythema  Skin: Warm dry intact with no signs of infection or rash on extremities or on axial skeleton.  Abdomen: Soft nontender  Neuro: Cranial nerves II through XII are intact, neurovascularly intact in all extremities with 2+ DTRs and 2+ pulses.  Lymph: No lymphadenopathy of posterior or anterior cervical chain or axillae bilaterally.  Gait normal with good balance and coordination.  MSK:  Non tender with full range of motion and good stability and symmetric strength and tone of shoulders, elbows, wrist, hip, knee and ankles bilaterally.  Back Exam:  Inspection: Mild loss in lordosis Motion: Flexion 45 deg, Extension 25 deg, Side Bending to 40 deg bilaterally,  Rotation to 45 deg bilaterally  SLR laying: Negative  XSLR laying: Negative  Palpable tenderness: Tender to palpation in the paraspinal musculature lumbar spine FABER: negative. Sensory change: Gross sensation intact to all lumbar and sacral dermatomes.  Reflexes: 2+ at both patellar tendons, 2+ at achilles tendons, Babinski's downgoing.  Strength at foot  Plantar-flexion: 5/5 Dorsi-flexion: 5/5 Eversion: 5/5 Inversion: 5/5  Leg strength  Quad: 5/5 Hamstring: 5/5 Hip flexor: 5/5 Hip abductors: 5/5  Gait unremarkable.      Impression and Recommendations:     This case required  medical decision making of moderate complexity.      Note: This dictation was prepared with Dragon dictation along with smaller phrase technology. Any transcriptional errors that result from this process are unintentional.

## 2016-11-30 ENCOUNTER — Encounter: Payer: Self-pay | Admitting: Family Medicine

## 2016-11-30 ENCOUNTER — Ambulatory Visit (INDEPENDENT_AMBULATORY_CARE_PROVIDER_SITE_OTHER): Payer: Managed Care, Other (non HMO) | Admitting: Family Medicine

## 2016-11-30 DIAGNOSIS — M461 Sacroiliitis, not elsewhere classified: Secondary | ICD-10-CM

## 2016-11-30 DIAGNOSIS — M4698 Unspecified inflammatory spondylopathy, sacral and sacrococcygeal region: Secondary | ICD-10-CM

## 2016-11-30 DIAGNOSIS — M47818 Spondylosis without myelopathy or radiculopathy, sacral and sacrococcygeal region: Secondary | ICD-10-CM

## 2016-11-30 MED ORDER — VITAMIN D (ERGOCALCIFEROL) 1.25 MG (50000 UNIT) PO CAPS: 50000.0000 [IU] | ORAL_CAPSULE | ORAL | 0 refills | Status: DC

## 2016-11-30 NOTE — Patient Instructions (Signed)
You are doing great  Keep it up  See me again in 6-8 weeks.

## 2016-11-30 NOTE — Assessment & Plan Note (Signed)
Better. Discussed icing regimen and home exercise. We discussed objective is to do an which was to avoid. Follow-up again in 6-8 weeks if not completely resolved

## 2017-01-17 NOTE — Progress Notes (Signed)
[facility] D.O. Brian Harmon [address] [place], [place] [address] Phone: [phone] Subjective:     CC: Low back pain follow-up  PIR:JJOACZYSAYHPI:Subjective  Brian LincolnDavid E Harmon is a 56 y.o. male coming in with complaint of low back pain.  Found to have more of a sacroiliac arthritis.  Patient had responded fairly well to osteopathic manipulation.  Patient states was doing 90% at her last follow-up.  Patient was encouraged to continue home exercise, icing regimen, which activities of doing which wants to avoid.  Patient states overall doing relatively well.  Does have some stiffness.  Denies no any radiation of pain anywhere.  More pain in the lower back.     History reviewed. No pertinent past medical history. History reviewed. No pertinent surgical history. Social History   Socioeconomic History  . Marital status: Married    Spouse name: None  . Number of children: None  . Years of education: None  . Highest education level: None  Social Needs  . Financial resource strain: None  . Food insecurity - worry: None  . Food insecurity - inability: None  . Transportation needs - medical: None  . Transportation needs - non-medical: None  Occupational History  . None  Tobacco Use  . Smoking status: Never Smoker  . Smokeless tobacco: Never Used  Substance and Sexual Activity  . Alcohol use: None  . Drug use: None  . Sexual activity: None  Other Topics Concern  . None  Social History Narrative  . None   Not on File History reviewed. No pertinent family history.   Past medical history, social, surgical and family history all reviewed in electronic medical record.  No pertanent information unless stated regarding to the chief complaint.   Review of Systems:Review of systems updated and as accurate as of 01/18/17  No headache, visual changes, nausea, vomiting, diarrhea, constipation, dizziness, abdominal pain, skin rash, fevers, chills, night sweats, weight loss, swollen lymph  nodes, body aches, joint swelling, muscle aches, chest pain, shortness of breath, mood changes.   Objective  Blood pressure 110/80, pulse 78, height 6\' 4"  (1.93 m), weight 237 lb (107.5 kg), SpO2 97 %. Systems examined below as of 01/18/17   General: No apparent distress alert and oriented x3 mood and affect normal, dressed appropriately.  HEENT: Pupils equal, extraocular movements intact  Respiratory: Patient's speak in full sentences and does not appear short of breath  Cardiovascular: No lower extremity edema, non tender, no erythema  Skin: Warm dry intact with no signs of infection or rash on extremities or on axial skeleton.  Abdomen: Soft nontender  Neuro: Cranial nerves II through XII are intact, neurovascularly intact in all extremities with 2+ DTRs and 2+ pulses.  Lymph: No lymphadenopathy of posterior or anterior cervical chain or axillae bilaterally.  Gait normal with good balance and coordination.  MSK:  Non tender with full range of motion and good stability and symmetric strength and tone of shoulders, elbows, wrist, hip, knee and ankles bilaterally.  Back Exam:  Inspection: Mild loss of lordosis Motion: Flexion 45 deg, Extension 25 deg, Side Bending to 45 deg bilaterally,  Rotation to 45 deg bilaterally  SLR laying: Negative  XSLR laying: Negative  Palpable tenderness: Tenderness over the right sacroiliac joint. FABER: Positive Faber. Sensory change: Gross sensation intact to all lumbar and sacral dermatomes.  Reflexes: 2+ at both patellar tendons, 2+ at achilles tendons, Babinski's downgoing.  Strength at foot  Plantar-flexion: 5/5 Dorsi-flexion: 5/5 Eversion: 5/5 Inversion:  5/5  Leg strength  Quad: 5/5 Hamstring: 5/5 Hip flexor: 5/5 Hip abductors: 5/5  Gait unremarkable.  Osteopathic findings C2 flexed rotated and side bent right C4 flexed rotated and side bent left C7 flexed rotated and side bent left T3 extended rotated and side bent right inhaled third rib L2  flexed rotated and side bent right Sacrum right on right     Impression and Recommendations:     This case required medical decision making of moderate complexity.      Note: This dictation was prepared with Dragon dictation along with smaller phrase technology. Any transcriptional errors that result from this process are unintentional.

## 2017-01-18 ENCOUNTER — Ambulatory Visit (INDEPENDENT_AMBULATORY_CARE_PROVIDER_SITE_OTHER): Payer: Managed Care, Other (non HMO) | Admitting: Family Medicine

## 2017-01-18 ENCOUNTER — Encounter: Payer: Self-pay | Admitting: Family Medicine

## 2017-01-18 DIAGNOSIS — M4698 Unspecified inflammatory spondylopathy, sacral and sacrococcygeal region: Secondary | ICD-10-CM

## 2017-01-18 DIAGNOSIS — M47818 Spondylosis without myelopathy or radiculopathy, sacral and sacrococcygeal region: Secondary | ICD-10-CM

## 2017-01-18 DIAGNOSIS — M999 Biomechanical lesion, unspecified: Secondary | ICD-10-CM | POA: Diagnosis not present

## 2017-01-18 NOTE — Assessment & Plan Note (Signed)
Decision today to treat with OMT was based on Physical Exam  After verbal consent patient was treated with HVLA, ME, FPR techniques in cervical, thoracic, lumbar and sacral areas  Patient tolerated the procedure well with improvement in symptoms  Patient given exercises, stretches and lifestyle modifications  See medications in patient instructions if given  Patient will follow up in 6-8 weeks 

## 2017-01-18 NOTE — Patient Instructions (Addendum)
Good to see you  You did great with manipulation  Keep up the exercises and I am fine with the vitmain D 2 times a wek  See me again in 5-6 weeks Happy holidays!

## 2017-01-18 NOTE — Assessment & Plan Note (Signed)
Patient does have the arthritis on the left side but was having more pain on the right.  Has responded very well to osteopathic manipulation recently.  We will start this on a more regular basis.  Patient will follow-up again in 6-8 weeks.

## 2017-02-28 NOTE — Progress Notes (Signed)
Tawana ScaleZach Karmina Harmon D.O. [facility] [address] [place], [place] [address] Phone: [phone] Subjective:    I'm seeing this patient by the request  of:    CC: Back pain follow-up  BJY:[id number]:Subjective  Brian LincolnDavid E Harmon is a 57 y.o. male coming in with complaint of back pain.  Found to have sacroiliac arthritis.  Has done relatively well with home exercise, vitamin D, topical anti-inflammatories and osteopathic manipulation.  Patient states overall he has been doing terrific.  Very minimal discomfort or pain at this time.  Happy with the results.     No past medical history on file. No past surgical history on file. Social History   Socioeconomic History  . Marital status: Married    Spouse name: Not on file  . Number of children: Not on file  . Years of education: Not on file  . Highest education level: Not on file  Social Needs  . Financial resource strain: Not on file  . Food insecurity - worry: Not on file  . Food insecurity - inability: Not on file  . Transportation needs - medical: Not on file  . Transportation needs - non-medical: Not on file  Occupational History  . Not on file  Tobacco Use  . Smoking status: Never Smoker  . Smokeless tobacco: Never Used  Substance and Sexual Activity  . Alcohol use: Not on file  . Drug use: Not on file  . Sexual activity: Not on file  Other Topics Concern  . Not on file  Social History Narrative  . Not on file   Not on File No family history on file.   Past medical history, social, surgical and family history all reviewed in electronic medical record.  No pertanent information unless stated regarding to the chief complaint.   Review of Systems:Review of systems updated and as accurate as of 02/28/17  No headache, visual changes, nausea, vomiting, diarrhea, constipation, dizziness, abdominal pain, skin rash, fevers, chills, night sweats, weight loss, swollen lymph nodes, body aches, joint swelling, muscle aches, chest  pain, shortness of breath, mood changes.   Objective  There were no vitals taken for this visit. Systems examined below as of 02/28/17   General: No apparent distress alert and oriented x3 mood and affect normal, dressed appropriately.  HEENT: Pupils equal, extraocular movements intact  Respiratory: Patient's speak in full sentences and does not appear short of breath  Cardiovascular: No lower extremity edema, non tender, no erythema  Skin: Warm dry intact with no signs of infection or rash on extremities or on axial skeleton.  Abdomen: Soft nontender  Neuro: Cranial nerves II through XII are intact, neurovascularly intact in all extremities with 2+ DTRs and 2+ pulses.  Lymph: No lymphadenopathy of posterior or anterior cervical chain or axillae bilaterally.  Gait normal with good balance and coordination.  MSK:  Non tender with full range of motion and good stability and symmetric strength and tone of shoulders, elbows, wrist, hip, knee and ankles bilaterally.  Back Exam:  Inspection: Mild loss of lordosis Motion: Flexion 45 deg, Extension 25 deg, Side Bending to 45 deg bilaterally,  Rotation to 45 deg bilaterally  SLR laying: Negative  XSLR laying: Negative  Palpable tenderness: Tender to palpation the paraspinal musculature lumbar spine right greater than left. FABER: Mild positive right. Sensory change: Gross sensation intact to all lumbar and sacral dermatomes.  Reflexes: 2+ at both patellar tendons, 2+ at achilles tendons, Babinski's downgoing.  Strength at foot  Plantar-flexion: 5/5  Dorsi-flexion: 5/5 Eversion: 5/5 Inversion: 5/5  Leg strength  Quad: 5/5 Hamstring: 5/5 Hip flexor: 5/5 Hip abductors: 5/5  Gait unremarkable.  Osteopathic findings  C2 flexed rotated and side bent right C4 flexed rotated and side bent left C7 flexed rotated and side bent left T3 extended rotated and side bent right inhaled third rib T9 extended rotated and side bent left L3 flexed rotated  and side bent right Sacrum right on right     Impression and Recommendations:     This case required medical decision making of moderate complexity.      Note: This dictation was prepared with Dragon dictation along with smaller phrase technology. Any transcriptional errors that result from this process are unintentional.

## 2017-03-01 ENCOUNTER — Other Ambulatory Visit: Payer: Self-pay | Admitting: Family Medicine

## 2017-03-01 ENCOUNTER — Ambulatory Visit (INDEPENDENT_AMBULATORY_CARE_PROVIDER_SITE_OTHER): Payer: Managed Care, Other (non HMO) | Admitting: Family Medicine

## 2017-03-01 ENCOUNTER — Encounter: Payer: Self-pay | Admitting: Family Medicine

## 2017-03-01 VITALS — BP 118/70 | HR 72 | Ht 76.0 in | Wt 237.0 lb

## 2017-03-01 DIAGNOSIS — M999 Biomechanical lesion, unspecified: Secondary | ICD-10-CM

## 2017-03-01 DIAGNOSIS — M4698 Unspecified inflammatory spondylopathy, sacral and sacrococcygeal region: Secondary | ICD-10-CM | POA: Diagnosis not present

## 2017-03-01 DIAGNOSIS — M47818 Spondylosis without myelopathy or radiculopathy, sacral and sacrococcygeal region: Secondary | ICD-10-CM

## 2017-03-01 NOTE — Assessment & Plan Note (Signed)
Doing very well with conservative therapy.  Encourage patient to continue the vitamin D.  Continue home exercises.  Follow-up again in 3 months

## 2017-03-01 NOTE — Patient Instructions (Signed)
Good to see you  Gustavus Bryantce is your friend.  Stay active.  See me again in 3 months Good luck with the pool

## 2017-03-01 NOTE — Assessment & Plan Note (Signed)
Decision today to treat with OMT was based on Physical Exam  After verbal consent patient was treated with HVLA, ME, FPR techniques in cervical, thoracic, lumbar and sacral areas  Patient tolerated the procedure well with improvement in symptoms  Patient given exercises, stretches and lifestyle modifications  See medications in patient instructions if given  Patient will follow up in 1-2 weeks 

## 2017-05-30 ENCOUNTER — Other Ambulatory Visit: Payer: Self-pay | Admitting: Family Medicine

## 2017-05-30 NOTE — Telephone Encounter (Signed)
Refill done.  

## 2017-08-28 ENCOUNTER — Other Ambulatory Visit: Payer: Self-pay | Admitting: Family Medicine

## 2017-10-25 ENCOUNTER — Encounter

## 2017-10-25 ENCOUNTER — Ambulatory Visit: Payer: Managed Care, Other (non HMO) | Admitting: Family Medicine

## 2017-10-26 NOTE — Progress Notes (Signed)
[name] [address] [place], [place] [address] Phone: [phone] Subjective:     CC: Back pain follow-up  [id number]  Brian Harmon is a 57 y.o. male coming in with complaint of back pain. Patient woke up two Saturdays ago in pain but is back to normal.  Patient denies any radiation of the pain.  Patient states that it was more on the lower back.  Neck pain seems to be doing relatively well.  Has been 8 months since last manipulation. Patient states that overall was feeling well until this exacerbation.      No past medical history on file. No past surgical history on file. Social History   Socioeconomic History  . Marital status: Married    Spouse name: Not on file  . Number of children: Not on file  . Years of education: Not on file  . Highest education level: Not on file  Occupational History  . Not on file  Social Needs  . Financial resource strain: Not on file  . Food insecurity:    Worry: Not on file    Inability: Not on file  . Transportation needs:    Medical: Not on file    Non-medical: Not on file  Tobacco Use  . Smoking status: Never Smoker  . Smokeless tobacco: Never Used  Substance and Sexual Activity  . Alcohol use: Not on file  . Drug use: Not on file  . Sexual activity: Not on file  Lifestyle  . Physical activity:    Days per week: Not on file    Minutes per session: Not on file  . Stress: Not on file  Relationships  . Social connections:    Talks on phone: Not on file    Gets together: Not on file    Attends religious service: Not on file    Active member of club or organization: Not on file    Attends meetings of clubs or organizations: Not on file    Relationship status: Not on file  Other Topics Concern  . Not on file  Social History Narrative  . Not on file   Not on File No family history on file.       Current Outpatient Medications (Other):  Marland Kitchen  Diclofenac Sodium (PENNSAID) 2 % SOLN,  Place 2 application onto the skin 2 (two) times daily. .  Vitamin D, Ergocalciferol, (DRISDOL) 50000 units CAPS capsule, TAKE 1 CAPSULE (50,000 UNITS TOTAL) BY MOUTH EVERY 7 (SEVEN) DAYS.    Past medical history, social, surgical and family history all reviewed in electronic medical record.  No pertanent information unless stated regarding to the chief complaint.   Review of Systems:  No headache, visual changes, nausea, vomiting, diarrhea, constipation, dizziness, abdominal pain, skin rash, fevers, chills, night sweats, weight loss, swollen lymph nodes, body aches, joint swelling, muscle aches, chest pain, shortness of breath, mood changes.   Objective  Blood pressure 132/84, pulse 67, height 6\' 4"  (1.93 m), SpO2 97 %.   General: No apparent distress alert and oriented x3 mood and affect normal, dressed appropriately.  HEENT: Pupils equal, extraocular movements intact  Respiratory: Patient's speak in full sentences and does not appear short of breath  Cardiovascular: No lower extremity edema, non tender, no erythema  Skin: Warm dry intact with no signs of infection or rash on extremities or on axial skeleton.  Abdomen: Soft nontender  Neuro: Cranial nerves II through XII are intact, neurovascularly intact in all  extremities with 2+ DTRs and 2+ pulses.  Lymph: No lymphadenopathy of posterior or anterior cervical chain or axillae bilaterally.  Gait normal with good balance and coordination.  MSK:  Non tender with full range of motion and good stability and symmetric strength and tone of shoulders, elbows, wrist, hip, knee and ankles bilaterally. Back Exam:  Inspection: Unremarkable  Motion: Flexion 45 deg, Extension 25 deg, Side Bending to 35 deg bilaterally,  Rotation to 30 deg bilaterally  SLR laying: Negative  XSLR laying: Negative  Palpable tenderness: Tender to palpation in the paraspinal musculature lumbar spine. FABER: Tightness bilaterally. Sensory change: Gross sensation intact  to all lumbar and sacral dermatomes.  Reflexes: 2+ at both patellar tendons, 2+ at achilles tendons, Babinski's downgoing.  Strength at foot  Plantar-flexion: 5/5 Dorsi-flexion: 5/5 Eversion: 5/5 Inversion: 5/5  Leg strength  Quad: 5/5 Hamstring: 5/5 Hip flexor: 5/5 Hip abductors: 4/5 but symmetric Gait unremarkable.  Osteopathic findings C2 flexed rotated and side bent right T3 extended rotated and side bent right inhaled third rib T5 extended rotated and side bent left L2 flexed rotated and side bent right Sacrum left on left     Impression and Recommendations:     This case required medical decision making of moderate complexity. The above documentation has been reviewed and is accurate and complete Brian SaaZachary M Smith, DO       Note: This dictation was prepared with Dragon dictation along with smaller phrase technology. Any transcriptional errors that result from this process are unintentional.

## 2017-10-29 ENCOUNTER — Ambulatory Visit (INDEPENDENT_AMBULATORY_CARE_PROVIDER_SITE_OTHER): Payer: Managed Care, Other (non HMO) | Admitting: Family Medicine

## 2017-10-29 ENCOUNTER — Encounter: Payer: Self-pay | Admitting: Family Medicine

## 2017-10-29 VITALS — BP 132/84 | HR 67 | Ht 76.0 in

## 2017-10-29 DIAGNOSIS — M461 Sacroiliitis, not elsewhere classified: Secondary | ICD-10-CM

## 2017-10-29 DIAGNOSIS — M999 Biomechanical lesion, unspecified: Secondary | ICD-10-CM

## 2017-10-29 DIAGNOSIS — M47818 Spondylosis without myelopathy or radiculopathy, sacral and sacrococcygeal region: Secondary | ICD-10-CM

## 2017-10-29 NOTE — Assessment & Plan Note (Signed)
Decision today to treat with OMT was based on Physical Exam  After verbal consent patient was treated with HVLA, ME, FPR techniques in  thoracic, lumbar and sacral areas  Patient tolerated the procedure well with improvement in symptoms  Patient given exercises, stretches and lifestyle modifications  See medications in patient instructions if given  Patient will follow up in 12 weeks 

## 2017-10-29 NOTE — Patient Instructions (Signed)
Good to see you  Ice is your friend Exercises 3 times a week.  When in pain call [phone] See me again in 3 months!

## 2017-10-29 NOTE — Assessment & Plan Note (Signed)
Patient responded well to manipulation.  Discussed icing regimen and home exercises.  Core stability.  Discussed topical anti-inflammatories.  Patient followed up again in 12 weeks

## 2017-11-22 ENCOUNTER — Other Ambulatory Visit: Payer: Self-pay | Admitting: Family Medicine

## 2017-11-25 ENCOUNTER — Other Ambulatory Visit: Payer: Self-pay | Admitting: Family Medicine

## 2018-11-27 ENCOUNTER — Encounter: Payer: Self-pay | Admitting: Family Medicine

## 2018-11-27 ENCOUNTER — Other Ambulatory Visit: Payer: Self-pay

## 2018-11-27 ENCOUNTER — Ambulatory Visit (INDEPENDENT_AMBULATORY_CARE_PROVIDER_SITE_OTHER): Payer: Managed Care, Other (non HMO) | Admitting: Family Medicine

## 2018-11-27 VITALS — HR 68 | Ht 76.0 in | Wt 227.0 lb

## 2018-11-27 DIAGNOSIS — M999 Biomechanical lesion, unspecified: Secondary | ICD-10-CM

## 2018-11-27 DIAGNOSIS — S32030A Wedge compression fracture of third lumbar vertebra, initial encounter for closed fracture: Secondary | ICD-10-CM

## 2018-11-27 NOTE — Assessment & Plan Note (Signed)
History of a compression fracture of the L3 vertebrae.  Patient does have some levoscoliosis and seems to be more at the L3 area.  Likely what is more secondary to this injury.  Discussed icing regimen, home exercise, what activities of doing which wants to avoid.  Patient is to increase activity slowly.  Follow-up again in 4 to 8 weeks

## 2018-11-27 NOTE — Progress Notes (Signed)
[facility] [address] [place], [place] [address] Phone: [phone] Subjective:   [name], am serving as a scribe for Dr. Hulan Saas.  I'm seeing this patient by the request  of:    CC: Back pain follow-up  [id number]    10/29/17: Patient responded well to manipulation.  Discussed icing regimen and home exercises.  Core stability.  Discussed topical anti-inflammatories.  Patient followed up again in 12 weeks  Update- 11/27/18 Brian Harmon is a 58 y.o. male coming in with complaint of low back pain. Last seen in 2019 for OMT. Patient states that he is here prevention.  Patient states that overall has been doing relatively well but is starting to have increasing discomfort as well now.  Patient does remain fairly active though appearing still works out on a fairly regular basis.    History reviewed. No pertinent past medical history. History reviewed. No pertinent surgical history. Social History   Socioeconomic History  . Marital status: Married    Spouse name: Not on file  . Number of children: Not on file  . Years of education: Not on file  . Highest education level: Not on file  Occupational History  . Not on file  Social Needs  . Financial resource strain: Not on file  . Food insecurity    Worry: Not on file    Inability: Not on file  . Transportation needs    Medical: Not on file    Non-medical: Not on file  Tobacco Use  . Smoking status: Never Smoker  . Smokeless tobacco: Never Used  Substance and Sexual Activity  . Alcohol use: Not on file  . Drug use: Not on file  . Sexual activity: Not on file  Lifestyle  . Physical activity    Days per week: Not on file    Minutes per session: Not on file  . Stress: Not on file  Relationships  . Social Herbalist on phone: Not on file    Gets together: Not on file    Attends religious service: Not on file    Active member of club or organization: Not on file     Attends meetings of clubs or organizations: Not on file    Relationship status: Not on file  Other Topics Concern  . Not on file  Social History Narrative  . Not on file   Not on File no known drug allergies History reviewed. No pertinent family history.  No family history of autoimmune diseases       Current Outpatient Medications (Other):  Marland Kitchen  Diclofenac Sodium (PENNSAID) 2 % SOLN, Place 2 application onto the skin 2 (two) times daily. .  Vitamin D, Ergocalciferol, (DRISDOL) 50000 units CAPS capsule, TAKE 1 CAPSULE (50,000 UNITS TOTAL) BY MOUTH EVERY 7 (SEVEN) DAYS.    Past medical history, social, surgical and family history all reviewed in electronic medical record.  No pertanent information unless stated regarding to the chief complaint.   Review of Systems:  No headache, visual changes, nausea, vomiting, diarrhea, constipation, dizziness, abdominal pain, skin rash, fevers, chills, night sweats, weight loss, swollen lymph nodes, body aches, joint swelling, , chest pain, shortness of breath, mood changes.  Positive muscle aches  Objective  Pulse 68, height 6\' 4"  (1.93 m), weight 227 lb (103 kg), SpO2 97 %.     General: No apparent distress alert and oriented x3 mood and affect normal, dressed appropriately.  HEENT:  Pupils equal, extraocular movements intact  Respiratory: Patient's speak in full sentences and does not appear short of breath  Cardiovascular: No lower extremity edema, non tender, no erythema  Skin: Warm dry intact with no signs of infection or rash on extremities or on axial skeleton.  Abdomen: Soft nontender  Neuro: Cranial nerves II through XII are intact, neurovascularly intact in all extremities with 2+ DTRs and 2+ pulses.  Lymph: No lymphadenopathy of posterior or anterior cervical chain or axillae bilaterally.  Gait normal with good balance and coordination.  MSK:  Non tender with full range of motion and good stability and symmetric strength and tone  of shoulders, elbows, wrist, hip, knee and ankles bilaterally.  Back Exam:  Inspection: Unremarkable  Motion: Flexion 35 deg, Extension 25 deg, Side Bending to 45 deg bilaterally,  Rotation to 45 deg bilaterally  SLR laying: Negative  XSLR laying: Negative  Palpable tenderness: Tightness noted in the thoracolumbar juncture right greater than left.  Mild pain though on the left paraspinal musculature in the lumbar spine mild levoscoliosis of the lumbar spine noted.Marland Kitchen FABER: negative. Sensory change: Gross sensation intact to all lumbar and sacral dermatomes.  Reflexes: 2+ at both patellar tendons, 2+ at achilles tendons, Babinski's downgoing.  Strength at foot  Plantar-flexion: 5/5 Dorsi-flexion: 5/5 Eversion: 5/5 Inversion: 5/5  Leg strength  Quad: 5/5 Hamstring: 5/5 Hip flexor: 5/5 Hip abductors: 5/5  Gait unremarkable.  Osteopathic findings  T11 extended rotated and side bent right L2 flexed rotated and side bent left Sacrum right on right    Impression and Recommendations:     This case required medical decision making of moderate complexity. The above documentation has been reviewed and is accurate and complete Judi Saa, DO       Note: This dictation was prepared with Dragon dictation along with smaller phrase technology. Any transcriptional errors that result from this process are unintentional.

## 2018-11-27 NOTE — Assessment & Plan Note (Signed)
Decision today to treat with OMT was based on Physical Exam  After verbal consent patient was treated with HVLA, ME, FPR techniques in  thoracic, lumbar and sacral areas  Patient tolerated the procedure well with improvement in symptoms  Patient given exercises, stretches and lifestyle modifications  See medications in patient instructions if given  Patient will follow up in 4-8 weeks 

## 2019-11-13 ENCOUNTER — Encounter: Payer: Self-pay | Admitting: Family Medicine

## 2019-11-13 ENCOUNTER — Other Ambulatory Visit: Payer: Self-pay

## 2019-11-13 ENCOUNTER — Ambulatory Visit (INDEPENDENT_AMBULATORY_CARE_PROVIDER_SITE_OTHER): Payer: Managed Care, Other (non HMO) | Admitting: Family Medicine

## 2019-11-13 VITALS — BP 110/70 | HR 72 | Ht 76.0 in | Wt 218.0 lb

## 2019-11-13 DIAGNOSIS — M47818 Spondylosis without myelopathy or radiculopathy, sacral and sacrococcygeal region: Secondary | ICD-10-CM

## 2019-11-13 DIAGNOSIS — S32030A Wedge compression fracture of third lumbar vertebra, initial encounter for closed fracture: Secondary | ICD-10-CM

## 2019-11-13 DIAGNOSIS — M999 Biomechanical lesion, unspecified: Secondary | ICD-10-CM

## 2019-11-13 DIAGNOSIS — M461 Sacroiliitis, not elsewhere classified: Secondary | ICD-10-CM

## 2019-11-13 MED ORDER — VITAMIN D (ERGOCALCIFEROL) 1.25 MG (50000 UNIT) PO CAPS: 50000.0000 [IU] | ORAL_CAPSULE | ORAL | 0 refills | Status: DC

## 2019-11-13 NOTE — Assessment & Plan Note (Signed)
Exacerbation of a chronic problem.  History of median compression fracture as well as the arthritis of the sacroiliac joints.  Good range of motion still noted.  Patient is still remaining significantly active.  We discussed icing regimen and home exercise.  May need to consider if any worsening pain repeating x-rays.  Patient will continue the vitamin D supplementation.  Follow-up with me again in 2 months

## 2019-11-13 NOTE — Patient Instructions (Addendum)
Good to see you [employer] Refilled vitamin D See me again in 2 months if needed

## 2019-11-13 NOTE — Progress Notes (Signed)
Brian Harmon [address] [place] [address] Phone: [phone] Subjective:   I Brian Harmon am serving as a Neurosurgeon for Dr. Antoine Harmon.  This visit occurred during the SARS-CoV-2 public health emergency.  Safety protocols were in place, including screening questions prior to the visit, additional usage of staff PPE, and extensive cleaning of exam room while observing appropriate contact time as indicated for disinfecting solutions.   I'm seeing this patient by the request  of:  Patient, No Pcp Per  CC: Neck and back pain follow-up  DJS:[id number]  Brian Harmon is a 59 y.o. male coming in with complaint of back and neck pain. OMT 11/27/2018. Patient states he has lower back pain. Back has gotten somewhat better.   Medications patient has been prescribed: Vit D  Taking: Yes         Reviewed prior external information including notes and imaging from previsou exam, outside providers and external EMR if available.   As well as notes that were available from care everywhere and other healthcare systems.  Past medical history, social, surgical and family history all reviewed in electronic medical record.  No pertanent information unless stated regarding to the chief complaint.   No past medical history on file.  Not on File   Review of Systems:  No headache, visual changes, nausea, vomiting, diarrhea, constipation, dizziness, abdominal pain, skin rash, fevers, chills, night sweats, weight loss, swollen lymph nodes, body aches, joint swelling, chest pain, shortness of breath, mood changes. POSITIVE muscle aches  Objective  Blood pressure 110/70, pulse 72, height 6\' 4"  (1.93 m), weight 218 lb (98.9 kg), SpO2 96 %.   General: No apparent distress alert and oriented x3 mood and affect normal, dressed appropriately.  HEENT: Pupils equal, extraocular movements intact  Respiratory: Patient's speak in full sentences and does not appear short  of breath  Cardiovascular: No lower extremity edema, non tender, no erythema  Neuro: Cranial nerves II through XII are intact, neurovascularly intact in all extremities with 2+ DTRs and 2+ pulses.  Gait normal with good balance and coordination.  MSK:  Non tender with full range of motion and good stability and symmetric strength and tone of shoulders, elbows, wrist, hip, knee and ankles bilaterally.  Mild arthritic changes of multiple joints Back -low back exam does have some loss of lordosis, tightness of the left sacroiliac joint.  Patient does have mild positive .  Tightness in the thoracolumbar juncture as well.  Osteopathic findings   T9 extended rotated and side bent left L2 flexed rotated and side bent right Sacrum left on left       Assessment and Plan:  Arthritis of left sacroiliac joint (HCC) Exacerbation of a chronic problem.  History of median compression fracture as well as the arthritis of the sacroiliac joints.  Good range of motion still noted.  Patient is still remaining significantly active.  We discussed icing regimen and home exercise.  May need to consider if any worsening pain repeating x-rays.  Patient will continue the vitamin D supplementation.  Follow-up with me again in 2 months  Compression fracture of L3 vertebra (HCC) Refill vitamin D, encourage patient to have it retested with his primary care.    Nonallopathic problems  Decision today to treat with OMT was based on Physical Exam  After verbal consent patient was treated with HVLA, ME, FPR techniques in thoracic, lumbar, and sacral  areas  Patient tolerated the procedure well with improvement in  symptoms  Patient given exercises, stretches and lifestyle modifications  See medications in patient instructions if given  Patient will follow up in 4-8 weeks      The above documentation has been reviewed and is accurate and complete Brian Saa, DO       Note: This dictation was  prepared with Dragon dictation along with smaller phrase technology. Any transcriptional errors that result from this process are unintentional.

## 2019-11-13 NOTE — Assessment & Plan Note (Signed)
Refill vitamin D, encourage patient to have it retested with his primary care.

## 2020-01-28 ENCOUNTER — Other Ambulatory Visit: Payer: Self-pay | Admitting: Family Medicine

## 2020-04-15 ENCOUNTER — Other Ambulatory Visit: Payer: Self-pay | Admitting: Family Medicine

## 2020-05-02 ENCOUNTER — Other Ambulatory Visit: Payer: Self-pay

## 2020-05-02 ENCOUNTER — Ambulatory Visit
Admission: RE | Admit: 2020-05-02 | Discharge: 2020-05-02 | Disposition: A | Discharge status: Home / Self Care | Payer: Managed Care, Other (non HMO) | Source: Ambulatory Visit | Attending: Physician Assistant | Admitting: Physician Assistant

## 2020-05-02 VITALS — BP 131/81 | HR 86 | Temp 98.2°F | Resp 20

## 2020-05-02 DIAGNOSIS — S90822A Blister (nonthermal), left foot, initial encounter: Secondary | ICD-10-CM | POA: Diagnosis not present

## 2020-05-02 MED ORDER — SULFAMETHOXAZOLE-TRIMETHOPRIM 800-160 MG PO TABS: 1.0000 | ORAL_TABLET | Freq: Two times a day (BID) | ORAL | 0 refills | Status: AC

## 2020-05-02 NOTE — ED Triage Notes (Signed)
Pt presents with c/o blister that now appears infected, developed about 3 days ago

## 2020-05-02 NOTE — ED Provider Notes (Signed)
[facility]    CSN: [id number] Arrival date & time: 05/02/20  0840      History   Chief Complaint Chief Complaint  Patient presents with  . Blister    HPI Brian Harmon is a 60 y.o. male.   The history is provided by the patient. No language interpreter was used.  Foot Pain This is a new problem. The current episode started yesterday. The problem occurs constantly. Nothing aggravates the symptoms. Nothing relieves the symptoms. He has tried nothing for the symptoms. The treatment provided no relief.   Pt reports he blistered his foot with new boots.  History reviewed. No pertinent past medical history.  Patient Active Problem List   Diagnosis Date Noted  . Nonallopathic lesion of sacral region 01/18/2017  . Nonallopathic lesion of lumbosacral region 01/18/2017  . Nonallopathic lesion of thoracic region 01/18/2017  . Arthritis of left sacroiliac joint 10/16/2016  . Compression fracture of L3 vertebra (HCC) 10/16/2016  . Uncontrolled type 2 diabetes mellitus without complication, without long-term current use of insulin 05/17/2016    History reviewed. No pertinent surgical history.     Home Medications    Prior to Admission medications   Medication Sig Start Date End Date Taking? Authorizing Provider  sulfamethoxazole-trimethoprim (BACTRIM DS) 800-160 MG tablet Take 1 tablet by mouth 2 (two) times daily. 05/02/20  Yes Cheron Schaumann K, PA-C  Diclofenac Sodium (PENNSAID) 2 % SOLN Place 2 application onto the skin 2 (two) times daily. 10/16/16   Judi Saa, DO  Vitamin D, Ergocalciferol, (DRISDOL) 1.25 MG (50000 UNIT) CAPS capsule TAKE 1 CAPSULE (50,000 UNITS TOTAL) BY MOUTH EVERY 7 (SEVEN) DAYS 04/15/20   Judi Saa, DO  Vitamin D, Ergocalciferol, (DRISDOL) 50000 units CAPS capsule TAKE 1 CAPSULE (50,000 UNITS TOTAL) BY MOUTH EVERY 7 (SEVEN) DAYS. 11/22/17   Judi Saa, DO    Family History Family History  Family history unknown: Yes     Social History Social History   Tobacco Use  . Smoking status: Never Smoker  . Smokeless tobacco: Never Used  Substance Use Topics  . Alcohol use: Not Currently  . Drug use: Never     Allergies   Patient has no known allergies.   Review of Systems Review of Systems  Skin: Positive for wound.  All other systems reviewed and are negative.    Physical Exam Triage Vital Signs ED Triage Vitals [05/02/20 0857]  Enc Vitals Group     BP 131/81     Pulse Rate 86     Resp 20     Temp 98.2 F (36.8 C)     Temp Source Oral     SpO2 95 %     Weight      Height      Head Circumference      Peak Flow      Pain Score      Pain Loc      Pain Edu?      Excl. in GC?    No data found.  Updated Vital Signs BP 131/81 (BP Location: Left Arm)   Pulse 86   Temp 98.2 F (36.8 C) (Oral)   Resp 20   SpO2 95%   Visual Acuity Right Eye Distance:   Left Eye Distance:   Bilateral Distance:    Right Eye Near:   Left Eye Near:    Bilateral Near:     Physical Exam Vitals reviewed.  Cardiovascular:  Rate and Rhythm: Normal rate.  Pulmonary:     Effort: Pulmonary effort is normal.  Skin:    Comments: 2cm round bliter left foot  nv and ns intact   Neurological:     General: No focal deficit present.     Mental Status: He is alert.  Psychiatric:        Mood and Affect: Mood normal.      UC Treatments / Results  Labs (all labs ordered are listed, but only abnormal results are displayed) Labs Reviewed - No data to display  EKG   Radiology No results found.  Procedures Procedures (including critical care time)  Medications Ordered in UC Medications - No data to display  Initial Impression / Assessment and Plan / UC Course  I have reviewed the triage vital signs and the nursing notes.  Pertinent labs & imaging results that were available during my care of the patient were reviewed by me and considered in my medical decision making (see chart for  details).     MDM:  Pt advised wound care,  Bactrim rx for infection  Final Clinical Impressions(s) / UC Diagnoses   Final diagnoses:  Blister of left foot, initial encounter     Discharge Instructions     Return if any problems.    ED Prescriptions    Medication Sig Dispense Auth. Provider   sulfamethoxazole-trimethoprim (BACTRIM DS) 800-160 MG tablet Take 1 tablet by mouth 2 (two) times daily. 14 tablet [place] Areas, [place]     PDMP not reviewed this encounter.  An After Visit Summary was printed and given to the patient.    Elson Areas, [place] 05/02/20 [phone]

## 2020-05-02 NOTE — Discharge Instructions (Signed)
Return if any problems.
